# Patient Record
Sex: Male | Born: 1995 | Race: Black or African American | Hispanic: No | Marital: Single | Smoking: Never smoker
Health system: Southern US, Community
[De-identification: ages and names within clinical notes are randomized; demographics above are authoritative.]

---
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
## 2015-06-21 ENCOUNTER — Emergency Department (HOSPITAL_COMMUNITY)
Admission: EM | Admit: 2015-06-21 | Discharge: 2015-06-21 | Disposition: A | Discharge status: Home / Self Care | Payer: No Typology Code available for payment source | Attending: Emergency Medicine | Admitting: Emergency Medicine

## 2015-06-21 ENCOUNTER — Encounter (HOSPITAL_COMMUNITY): Payer: Self-pay | Admitting: Emergency Medicine

## 2015-06-21 DIAGNOSIS — Y9389 Activity, other specified: Secondary | ICD-10-CM | POA: Diagnosis not present

## 2015-06-21 DIAGNOSIS — S3992XA Unspecified injury of lower back, initial encounter: Secondary | ICD-10-CM | POA: Diagnosis not present

## 2015-06-21 DIAGNOSIS — Y998 Other external cause status: Secondary | ICD-10-CM | POA: Insufficient documentation

## 2015-06-21 DIAGNOSIS — S8992XA Unspecified injury of left lower leg, initial encounter: Secondary | ICD-10-CM | POA: Insufficient documentation

## 2015-06-21 DIAGNOSIS — S199XXA Unspecified injury of neck, initial encounter: Secondary | ICD-10-CM | POA: Diagnosis not present

## 2015-06-21 DIAGNOSIS — Y92410 Unspecified street and highway as the place of occurrence of the external cause: Secondary | ICD-10-CM | POA: Diagnosis not present

## 2015-06-21 DIAGNOSIS — S8991XA Unspecified injury of right lower leg, initial encounter: Secondary | ICD-10-CM | POA: Insufficient documentation

## 2015-06-21 MED ORDER — IBUPROFEN 600 MG PO TABS: 600.0000 mg | ORAL_TABLET | Freq: Four times a day (QID) | ORAL | Status: DC | PRN
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
## 2015-06-21 NOTE — ED Notes (Signed)
Declined W/C at D/C and was escorted to lobby by RN. 

## 2015-06-23 ENCOUNTER — Emergency Department (HOSPITAL_COMMUNITY): Payer: No Typology Code available for payment source

## 2015-06-23 ENCOUNTER — Encounter (HOSPITAL_COMMUNITY): Payer: Self-pay | Admitting: Emergency Medicine

## 2015-06-23 ENCOUNTER — Emergency Department (HOSPITAL_COMMUNITY)
Admission: EM | Admit: 2015-06-23 | Discharge: 2015-06-23 | Disposition: A | Discharge status: Home / Self Care | Payer: No Typology Code available for payment source | Attending: Emergency Medicine | Admitting: Emergency Medicine

## 2015-06-23 DIAGNOSIS — S3992XD Unspecified injury of lower back, subsequent encounter: Secondary | ICD-10-CM | POA: Insufficient documentation

## 2015-06-23 MED ORDER — CYCLOBENZAPRINE HCL 10 MG PO TABS
5.0000 mg | ORAL_TABLET | Freq: Once | ORAL | Status: AC
  Administered 2015-06-23: 5 mg via ORAL
  Filled 2015-06-23: qty 1

## 2015-06-23 MED ORDER — CYCLOBENZAPRINE HCL 5 MG PO TABS: 5.0000 mg | ORAL_TABLET | Freq: Three times a day (TID) | ORAL | Status: DC | PRN

## 2015-06-23 NOTE — Discharge Instructions (Signed)
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
## 2015-06-23 NOTE — ED Notes (Signed)
Pt verbalizes understanding of discharge instructions. NAD. VSS. A/O x4. Ambulatory with steady gait.

## 2015-06-23 NOTE — ED Notes (Signed)
Patient transported to X-ray 
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
## 2021-07-03 ENCOUNTER — Encounter: Payer: Self-pay | Admitting: Emergency Medicine

## 2021-07-03 ENCOUNTER — Ambulatory Visit
Admission: EM | Admit: 2021-07-03 | Discharge: 2021-07-03 | Disposition: A | Discharge status: Home / Self Care | Payer: Self-pay | Attending: Urgent Care | Admitting: Urgent Care

## 2021-07-03 ENCOUNTER — Other Ambulatory Visit: Payer: Self-pay

## 2021-07-03 DIAGNOSIS — L739 Follicular disorder, unspecified: Secondary | ICD-10-CM

## 2021-07-03 MED ORDER — CEPHALEXIN 500 MG PO CAPS: 500.0000 mg | ORAL_CAPSULE | Freq: Three times a day (TID) | ORAL | 0 refills | Status: DC

## 2021-07-03 NOTE — ED Provider Notes (Signed)
[facility]   MRN: [id number] DOB: [birth year]  Subjective:   Kurt Austin is a 25 y.o. male presenting for 3 day history of acute onset mildly painful bumps over the pubic area.  Patient states that he recently shaved the area using clippers and did not use a car.  Denies fever, nausea, vomiting, abdominal or pelvic pain, dysuria.  No blisterlike rashes.  No current facility-administered medications for this encounter.  Current Outpatient Medications:    acetaminophen-codeine (TYLENOL #3) 300-30 MG per tablet, Take 1-2 tablets by mouth every 6 (six) hours as needed for pain., Disp: 15 tablet, Rfl: 0   cyclobenzaprine (FLEXERIL) 5 MG tablet, Take 1 tablet (5 mg total) by mouth 3 (three) times daily as needed for muscle spasms. (Patient not taking: Reported on 07/03/2021), Disp: 30 tablet, Rfl: 0   ibuprofen (ADVIL,MOTRIN) 600 MG tablet, Take 1 tablet (600 mg total) by mouth every 6 (six) hours as needed for mild pain or moderate pain., Disp: 15 tablet, Rfl: 0  Facility-Administered Medications Ordered in Other Encounters:    cefTRIAXone (ROCEPHIN) injection 250 mg, 250 mg, Intramuscular, Once, Jimmie Molly, MD   No Known Allergies  History reviewed. No pertinent past medical history.   History reviewed. No pertinent surgical history.  History reviewed. No pertinent family history.  Social History   Tobacco Use   Smoking status: Never  Substance Use Topics   Alcohol use: No   Drug use: No    ROS   Objective:   Vitals: BP (!) 147/72 (BP Location: Left Arm)   Pulse 83   Temp 98.2 F (36.8 C) (Oral)   Resp 18   SpO2 97%   Physical Exam Constitutional:      General: He is not in acute distress.    Appearance: Normal appearance. He is well-developed and normal weight. He is not ill-appearing, toxic-appearing or diaphoretic.  HENT:     Head: Normocephalic and atraumatic.     Right Ear: External ear normal.     Left Ear: External ear normal.     Nose:  Nose normal.     Mouth/Throat:     Pharynx: Oropharynx is clear.  Eyes:     General: No scleral icterus.       Right eye: No discharge.        Left eye: No discharge.     Extraocular Movements: Extraocular movements intact.     Pupils: Pupils are equal, round, and reactive to light.  Cardiovascular:     Rate and Rhythm: Normal rate.  Pulmonary:     Effort: Pulmonary effort is normal.  Musculoskeletal:     Cervical back: Normal range of motion.  Skin:    Findings: Rash (3 distinct solitary nodular lesions associated with hair follicles) present.  Neurological:     Mental Status: He is alert and oriented to person, place, and time.  Psychiatric:        Mood and Affect: Mood normal.        Behavior: Behavior normal.        Thought Content: Thought content normal.        Judgment: Judgment normal.    Assessment and Plan :   PDMP not reviewed this encounter.  1. Folliculitis     Start Keflex to address folliculitis.  Counseled patient on wound care and recommended using guards for his clippers going forward.  Low suspicion for monkey box, genital herpes given physical exam findings.  Counseled patient on potential for adverse  effects with medications prescribed/recommended today, ER and return-to-clinic precautions discussed, patient verbalized understanding.    Wallis Bamberg, PA-C 07/03/21 1511

## 2021-07-03 NOTE — ED Triage Notes (Signed)
Pt sts rash in genital area after shaving with new products x 3 days

## 2021-07-08 ENCOUNTER — Ambulatory Visit: Payer: Self-pay

## 2021-07-09 ENCOUNTER — Ambulatory Visit: Payer: Self-pay

## 2021-07-09 ENCOUNTER — Encounter: Payer: Self-pay | Admitting: General Practice

## 2021-07-09 ENCOUNTER — Other Ambulatory Visit: Payer: Self-pay

## 2021-07-09 ENCOUNTER — Ambulatory Visit
Admission: EM | Admit: 2021-07-09 | Discharge: 2021-07-09 | Disposition: A | Discharge status: Home / Self Care | Payer: Self-pay | Attending: Internal Medicine | Admitting: Internal Medicine

## 2021-07-09 DIAGNOSIS — T7840XA Allergy, unspecified, initial encounter: Secondary | ICD-10-CM

## 2021-07-09 DIAGNOSIS — L739 Follicular disorder, unspecified: Secondary | ICD-10-CM

## 2021-07-09 MED ORDER — DOXYCYCLINE HYCLATE 100 MG PO CAPS: 100.0000 mg | ORAL_CAPSULE | Freq: Two times a day (BID) | ORAL | 0 refills | Status: DC

## 2021-07-09 NOTE — Discharge Instructions (Addendum)
You have been prescribed a new antibiotic to take for skin infection. You may take over the counter benadryl to help with allergic reaction rash. Follow up if symptoms do not resolve.

## 2021-07-09 NOTE — ED Triage Notes (Signed)
Shaved the groin area, started itching, follicles bump developed , provider precibed antibiotic, took x1 week, developed body rashes, then stopped taking the medication

## 2021-07-09 NOTE — ED Provider Notes (Signed)
[facility]    CSN: [id number] Arrival date & time: 07/09/21  1660      History   Chief Complaint Chief Complaint  Patient presents with   Rash    HPI Kurt Austin is a 25 y.o. male.   Patient presents for reevaluation of infected hair follicles. Patient was seen at urgent care on 8/21 and was prescribed cephalexin for folliculitis in the groin. Patient states that he took the medication for 4 days, and then broke out in an itchy rash. He stopped taking the medication when this happened. Folliculitis has improved in appearance per patient. Denies any fever or drainage from lesions. Patient has not yet taken any OTC medications to help alleviate rash. Denies any difficulty breathing.      Rash  History reviewed. No pertinent past medical history.  There are no problems to display for this patient.   History reviewed. No pertinent surgical history.     Home Medications    Prior to Admission medications   Medication Sig Start Date End Date Taking? Authorizing Provider  doxycycline (VIBRAMYCIN) 100 MG capsule Take 1 capsule (100 mg total) by mouth 2 (two) times daily. 07/09/21  Yes Lance Muss, FNP  acetaminophen-codeine (TYLENOL #3) 300-30 MG per tablet Take 1-2 tablets by mouth every 6 (six) hours as needed for pain. 08/08/12   Jimmie Molly, MD  cyclobenzaprine (FLEXERIL) 5 MG tablet Take 1 tablet (5 mg total) by mouth 3 (three) times daily as needed for muscle spasms. Patient not taking: Reported on 07/03/2021 06/23/15   Araceli Bouche, DO  ibuprofen (ADVIL,MOTRIN) 600 MG tablet Take 1 tablet (600 mg total) by mouth every 6 (six) hours as needed for mild pain or moderate pain. 06/21/15   Trixie Dredge, PA-C    Family History History reviewed. No pertinent family history.  Social History Social History   Tobacco Use   Smoking status: Never  Substance Use Topics   Alcohol use: No   Drug use: No     Allergies   Cephalexin   Review of  Systems Review of Systems  Skin:  Positive for rash.  Per HPI  Physical Exam Triage Vital Signs ED Triage Vitals  Enc Vitals Group     BP 07/09/21 0932 (!) 144/82     Pulse Rate 07/09/21 0932 97     Resp 07/09/21 0932 18     Temp 07/09/21 0932 98.1 F (36.7 C)     Temp Source 07/09/21 0932 Oral     SpO2 07/09/21 0932 95 %     Weight --      Height --      Head Circumference --      Peak Flow --      Pain Score 07/09/21 0938 2     Pain Loc --      Pain Edu? --      Excl. in GC? --    No data found.  Updated Vital Signs BP (!) 144/82 (BP Location: Left Arm)   Pulse 97   Temp 98.1 F (36.7 C) (Oral)   Resp 18   SpO2 95%   Visual Acuity Right Eye Distance:   Left Eye Distance:   Bilateral Distance:    Right Eye Near:   Left Eye Near:    Bilateral Near:     Physical Exam Exam conducted with a chaperone present.  Constitutional:      Appearance: Normal appearance.  HENT:     Head: Normocephalic  and atraumatic.  Eyes:     Extraocular Movements: Extraocular movements intact.     Conjunctiva/sclera: Conjunctivae normal.  Pulmonary:     Effort: Pulmonary effort is normal.  Skin:    General: Skin is warm and dry.     Findings: Lesion and rash present. Rash is urticarial.     Comments: Three raised lesions that are consistent with hair follicles are present to suprapubic area directly above penis. Lesions do not have any drainage and appear to be healing well.   Patient also have erythematous diffuse rash present to bilateral arms.   Neurological:     General: No focal deficit present.     Mental Status: He is alert and oriented to person, place, and time. Mental status is at baseline.  Psychiatric:        Mood and Affect: Mood normal.        Behavior: Behavior normal.        Thought Content: Thought content normal.        Judgment: Judgment normal.     UC Treatments / Results  Labs (all labs ordered are listed, but only abnormal results are  displayed) Labs Reviewed - No data to display  EKG   Radiology No results found.  Procedures Procedures (including critical care time)  Medications Ordered in UC Medications - No data to display  Initial Impression / Assessment and Plan / UC Course  I have reviewed the triage vital signs and the nursing notes.  Pertinent labs & imaging results that were available during my care of the patient were reviewed by me and considered in my medical decision making (see chart for details).     Will start doxycycline for treatment of folliculitis. Advised patient to also use warm compresses. Patient to uses OTC benadryl to help treat allergic reaction. Cephalexin added to allergy list. Advised patient to go to the hospital if difficulty breathing starts. Discussed strict return precautions. Patient verbalized understanding and is agreeable with plan.  Final Clinical Impressions(s) / UC Diagnoses   Final diagnoses:  Folliculitis  Allergic reaction to drug, initial encounter     Discharge Instructions      You have been prescribed a new antibiotic to take for skin infection. You may take over the counter benadryl to help with allergic reaction rash. Follow up if symptoms do not resolve.      ED Prescriptions     Medication Sig Dispense Auth. Provider   doxycycline (VIBRAMYCIN) 100 MG capsule Take 1 capsule (100 mg total) by mouth 2 (two) times daily. 20 capsule Lance Muss, FNP      PDMP not reviewed this encounter.   Lance Muss, FNP 07/09/21 1009

## 2021-07-10 ENCOUNTER — Encounter: Payer: Self-pay | Admitting: Emergency Medicine

## 2021-07-10 ENCOUNTER — Ambulatory Visit
Admission: EM | Admit: 2021-07-10 | Discharge: 2021-07-10 | Disposition: A | Discharge status: Home / Self Care | Payer: Self-pay | Attending: Internal Medicine | Admitting: Internal Medicine

## 2021-07-10 DIAGNOSIS — R21 Rash and other nonspecific skin eruption: Secondary | ICD-10-CM | POA: Insufficient documentation

## 2021-07-10 DIAGNOSIS — Z113 Encounter for screening for infections with a predominantly sexual mode of transmission: Secondary | ICD-10-CM | POA: Insufficient documentation

## 2021-07-10 NOTE — ED Provider Notes (Signed)
[facility]    CSN: [id number] Arrival date & time: 07/10/21  3557      History   Chief Complaint Chief Complaint  Patient presents with   Rash   SEXUALLY TRANSMITTED DISEASE    HPI Kurt Austin is a 24 y.o. male.   Patient presents for reevaluation of the rash to arms, face, back that has worsened since visit yesterday.  Patient thought he was having allergic reaction to antibiotic at previous visit, but rash has changed and worsened since yesterday.  Rash is slightly itchy but is not painful.  Denies any known fevers or sick contacts.  Denies any difficulty breathing.  Denies any changes to lotions, soaps, detergents, foods, etc.  Patient also requesting STD testing.  Denies any known exposure or symptoms.   Rash  History reviewed. No pertinent past medical history.  There are no problems to display for this patient.   History reviewed. No pertinent surgical history.     Home Medications    Prior to Admission medications   Medication Sig Start Date End Date Taking? Authorizing Provider  acetaminophen-codeine (TYLENOL #3) 300-30 MG per tablet Take 1-2 tablets by mouth every 6 (six) hours as needed for pain. 08/08/12   Jimmie Molly, MD  cyclobenzaprine (FLEXERIL) 5 MG tablet Take 1 tablet (5 mg total) by mouth 3 (three) times daily as needed for muscle spasms. Patient not taking: Reported on 07/03/2021 06/23/15   Araceli Bouche, DO  doxycycline (VIBRAMYCIN) 100 MG capsule Take 1 capsule (100 mg total) by mouth 2 (two) times daily. 07/09/21   Lance Muss, FNP  ibuprofen (ADVIL,MOTRIN) 600 MG tablet Take 1 tablet (600 mg total) by mouth every 6 (six) hours as needed for mild pain or moderate pain. 06/21/15   Trixie Dredge, PA-C    Family History History reviewed. No pertinent family history.  Social History Social History   Tobacco Use   Smoking status: Never   Smokeless tobacco: Never  Substance Use Topics   Alcohol use: No   Drug use: No      Allergies   Cephalexin   Review of Systems Review of Systems Per HPI  Physical Exam Triage Vital Signs ED Triage Vitals [07/10/21 0838]  Enc Vitals Group     BP (!) 148/92     Pulse Rate (!) 109     Resp 16     Temp 98.1 F (36.7 C)     Temp Source Oral     SpO2 95 %     Weight      Height      Head Circumference      Peak Flow      Pain Score 0     Pain Loc      Pain Edu?      Excl. in GC?    No data found.  Updated Vital Signs BP (!) 148/92 (BP Location: Left Arm)   Pulse (!) 109   Temp 98.1 F (36.7 C) (Oral)   Resp 16   SpO2 95%   Visual Acuity Right Eye Distance:   Left Eye Distance:   Bilateral Distance:    Right Eye Near:   Left Eye Near:    Bilateral Near:     Physical Exam Constitutional:      Appearance: Normal appearance.  HENT:     Head: Normocephalic and atraumatic.  Eyes:     Extraocular Movements: Extraocular movements intact.     Conjunctiva/sclera: Conjunctivae normal.  Pulmonary:  Effort: Pulmonary effort is normal.  Skin:    General: Skin is warm and dry.     Findings: Rash present. Rash is urticarial and vesicular.     Comments: Diffuse, raised, vesicular lesions present throughout bilateral arms, chest, face, back.  Neurological:     General: No focal deficit present.     Mental Status: He is alert and oriented to person, place, and time. Mental status is at baseline.  Psychiatric:        Mood and Affect: Mood normal.        Behavior: Behavior normal.        Thought Content: Thought content normal.        Judgment: Judgment normal.     UC Treatments / Results  Labs (all labs ordered are listed, but only abnormal results are displayed) Labs Reviewed  MONKEYPOX VIRUS DNA, QUALITATIVE REAL-TIME PCR  MONKEYPOX VIRUS DNA, QUALITATIVE REAL-TIME PCR  HIV ANTIBODY (ROUTINE TESTING W REFLEX)  RPR  CYTOLOGY, (ORAL, ANAL, URETHRAL) ANCILLARY ONLY    EKG   Radiology No results found.  Procedures Procedures  (including critical care time)  Medications Ordered in UC Medications - No data to display  Initial Impression / Assessment and Plan / UC Course  I have reviewed the triage vital signs and the nursing notes.  Pertinent labs & imaging results that were available during my care of the patient were reviewed by me and considered in my medical decision making (see chart for details).     Rash is highly suspicious of Monkey Pox. Two viral swabs obtained from left arm and lower back.  Advised patient to quarantine until test results are complete.  Communicable disease branch, infection prevention, and infectious disease doctor on call Staci Righter) notified per protocol.  Supervising physician also notified.  HIV and RPR test pending as recommended by infectious disease doctor previously.  Patient also requesting urethral swab to be performed.  Per protocol, patient was asked sexual preference, and patient stated they usually have sexual intercourse with males. Discussed strict return precautions. Patient verbalized understanding and is agreeable with plan.  Final Clinical Impressions(s) / UC Diagnoses   Final diagnoses:  Screening examination for venereal disease  Rash and nonspecific skin eruption     Discharge Instructions      Your test for monkey pox is pending. We will call if this is positive. You will need to quarantine until test results are complete.      ED Prescriptions   None    PDMP not reviewed this encounter.   Lance Muss, FNP 07/10/21 [phone]

## 2021-07-10 NOTE — Discharge Instructions (Addendum)
Your test for monkey pox is pending. We will call if this is positive. You will need to quarantine until test results are complete.

## 2021-07-10 NOTE — ED Triage Notes (Signed)
Seen yesterday for a rash, states the rash is spreading to face. Wants to be tested for STDs.

## 2021-07-12 LAB — MONKEYPOX VIRUS DNA, QUALITATIVE REAL-TIME PCR
Orthopoxvirus DNA, QL PCR: DETECTED — AB
Orthopoxvirus DNA, QL PCR: DETECTED — AB

## 2021-07-12 LAB — HIV ANTIBODY (ROUTINE TESTING W REFLEX): HIV Screen 4th Generation wRfx: NONREACTIVE

## 2021-07-12 LAB — CYTOLOGY, (ORAL, ANAL, URETHRAL) ANCILLARY ONLY
Chlamydia: NEGATIVE
Comment: NEGATIVE
Comment: NEGATIVE
Comment: NORMAL
Neisseria Gonorrhea: NEGATIVE
Trichomonas: NEGATIVE

## 2021-07-12 LAB — RPR: RPR Ser Ql: NONREACTIVE

## 2021-07-13 ENCOUNTER — Ambulatory Visit (INDEPENDENT_AMBULATORY_CARE_PROVIDER_SITE_OTHER): Payer: Self-pay | Admitting: Physician Assistant

## 2021-07-13 ENCOUNTER — Other Ambulatory Visit: Payer: Self-pay

## 2021-07-13 ENCOUNTER — Encounter: Payer: Self-pay | Admitting: Physician Assistant

## 2021-07-13 VITALS — BP 147/85 | HR 100 | Temp 98.6°F | Wt 190.0 lb

## 2021-07-13 DIAGNOSIS — B04 Monkeypox: Secondary | ICD-10-CM

## 2021-07-13 MED ORDER — TPOXX 200 MG PO CAPS: 600.0000 mg | ORAL_CAPSULE | Freq: Two times a day (BID) | ORAL | 0 refills | Status: AC

## 2021-07-13 NOTE — Patient Instructions (Signed)
Codee nice meeting you today.  With monkeypox you were provided treatment with TPOXX after we discussed and reviewed Informed consent.  Copy is provided for your reference.  Please take as directed with the fatty meal and water 30 minutes before each dose.  Remain isolated home and from pet. Disinfect surfaces and linens as we discussed once lesions resolved. We will follow up next weds.  Maintain diary throughout treatment. Please contact us if you develop any side effects to drug or you continue to develop lesions 2-3 days after initiating treatment.

## 2021-07-13 NOTE — Progress Notes (Signed)
Dr. Odette Fraction with Infectious Disease was contacted in regards to positive results for MonkeyPox. Per ID, patient will need to follow up at infectious disease outpatient office [facility]) for further evaluation and treatment. Dr. Zigmund Daniel stated that they would contact staff at office to call patient to set up an appointment. Patient was notified and provided with contact information.

## 2021-07-13 NOTE — Progress Notes (Signed)
Counseled patient on tecovirimat administration. Patient will take 3 capsules (600 mg) by mouth twice daily for 14 days. Gave 84 tablets to complete treatment. Counseled to take with a full, fatty meal of at least 600 calories and 25 gm of fat. Gave him a chart to monitor his lesions and document his doses. Counseled that the most common side effect is headaches. He will reach out if he has any questions or concerns.   

## 2021-07-13 NOTE — Progress Notes (Signed)
Subjective:    Patient ID: Kurt Austin, male    DOB: [birth year], 25 y.o.   MRN: [id number]  Chief Complaint  Patient presents with   confirmed MPX by ED 07/10/21 seek TPOXX treatment     HPI:  Kurt Austin is a 26 y.o. male who presents today for HMPX treatment. He was confirmed by ED 07/10/21. Onset of lesions were 07/07/2021 located on arm appeared like "pimples"  they continued to spread all over body. Location shaft of penis, back, torso, soles of feet, face, upper and lower extremities. Lesions described as initially painful,red, raised and fluid filled.  No recent travel. No known contacts dxd with MPX or similar rash. No roommates, 1 pet dog who is isolating.  Sexual hx: MSM and denies any sexual partners in over 1 year. He has been "scared" and would like to discuss PrEP. He has had minimal contact with others-currently not working and has no car-will go to [employer] only.   He denies oral lesions, rectal pain or pain with Bms.   Allergies  Allergen Reactions   Cephalexin Rash      Outpatient Medications Prior to Visit  Medication Sig Dispense Refill   acetaminophen-codeine (TYLENOL #3) 300-30 MG per tablet Take 1-2 tablets by mouth every 6 (six) hours as needed for pain. 15 tablet 0   cyclobenzaprine (FLEXERIL) 5 MG tablet Take 1 tablet (5 mg total) by mouth 3 (three) times daily as needed for muscle spasms. (Patient not taking: Reported on 07/03/2021) 30 tablet 0   doxycycline (VIBRAMYCIN) 100 MG capsule Take 1 capsule (100 mg total) by mouth 2 (two) times daily. 20 capsule 0   ibuprofen (ADVIL,MOTRIN) 600 MG tablet Take 1 tablet (600 mg total) by mouth every 6 (six) hours as needed for mild pain or moderate pain. 15 tablet 0   Facility-Administered Medications Prior to Visit  Medication Dose Route Frequency Provider Last Rate Last Admin   cefTRIAXone (ROCEPHIN) injection 250 mg  250 mg Intramuscular Once Jimmie Molly, MD         History reviewed. No  pertinent past medical history.   History reviewed. No pertinent surgical history.     Review of Systems  Constitutional: Negative.   HENT: Negative.  Negative for nosebleeds.   Respiratory: Negative.    Cardiovascular: Negative.   Genitourinary:  Positive for genital sores.  Skin:  Positive for rash.  Hematological:  Negative for adenopathy.  Psychiatric/Behavioral: Negative.       Objective:    BP (!) 147/85   Pulse 100   Temp 98.6 F (37 C) (Oral)   Wt 190 lb (86.2 kg)  Nursing note and vital signs reviewed.  Physical Exam Vitals reviewed.  Constitutional:      Appearance: Normal appearance.  HENT:     Head: Normocephalic and atraumatic.     Mouth/Throat:     Mouth: Mucous membranes are moist.     Pharynx: Oropharynx is clear.  Eyes:     Extraocular Movements: Extraocular movements intact.     Conjunctiva/sclera: Conjunctivae normal.     Pupils: Pupils are equal, round, and reactive to light.  Cardiovascular:     Rate and Rhythm: Normal rate and regular rhythm.     Heart sounds: Normal heart sounds. No murmur heard.   No gallop.  Pulmonary:     Effort: Pulmonary effort is normal.     Breath sounds: Normal breath sounds.  Musculoskeletal:     Cervical back: Normal  range of motion and neck supple.  Lymphadenopathy:     Cervical: Cervical adenopathy present.  Skin:    Comments: Lesions scattered over all surfaces of body several maculopapular lesions 5 mm in diameter, with erythema at margins, Several are vesicular measuring 1 cm in diameter along extremities.  See photos. No exudate or d/c  Neurological:     General: No focal deficit present.     Mental Status: He is alert and oriented to person, place, and time.  Psychiatric:        Mood and Affect: Mood normal.        Behavior: Behavior normal.        Thought Content: Thought content normal.        Judgment: Judgment normal.              No flowsheet data found.     Assessment & Plan:     Patient Active Problem List   Diagnosis Date Noted   Human monkeypox 07/13/2021   Obtained IC, reviewed personally with patient for 20 minutes all questions answered.  Discussed isolation and typical evolution of lesions. Diary provided ot maintain at home Interested in PrEP and scheduled with Marchelle Folks to discuss following treatment on 07/28/21.  TPOXX 600 mg twice daily x 14 days Reviewed labs and notes from ED 07/10/21 and 07/09/21    Problem List Items Addressed This Visit       Other   Human monkeypox - Primary   Relevant Medications   tecovirimat (TPOXX) capsule     I am having Reichen L. Mcmasters start on Tpoxx. I am also having him maintain his acetaminophen-codeine, ibuprofen, cyclobenzaprine, and doxycycline.   Meds ordered this encounter  Medications   tecovirimat (TPOXX) capsule    Sig: Take 3 capsules (600 mg total) by mouth 2 (two) times daily with a meal for 14 days.    Dispense:  84 capsule    Refill:  0    Order Specific Question:   Supervising Provider    Answer:   VAN DAM, CORNELIUS N [[id number]]     Follow-up: Return in about 1 week (around 07/20/2021) for interim treatment remote visit.Marland Kitchen

## 2021-07-20 ENCOUNTER — Other Ambulatory Visit: Payer: Self-pay

## 2021-07-20 ENCOUNTER — Telehealth (INDEPENDENT_AMBULATORY_CARE_PROVIDER_SITE_OTHER): Payer: Self-pay | Admitting: Physician Assistant

## 2021-07-20 ENCOUNTER — Encounter: Payer: Self-pay | Admitting: Physician Assistant

## 2021-07-20 DIAGNOSIS — B04 Monkeypox: Secondary | ICD-10-CM

## 2021-07-20 NOTE — Patient Instructions (Addendum)
Continue tpoxx as instructed Follow up in 1 week Remain isolated at home Disinfect and sterilize linens

## 2021-07-20 NOTE — Progress Notes (Addendum)
Subjective:    Patient ID: Kurt Austin, male    DOB: 06-28-1996, 25 y.o.   MRN: [id number]  Chief Complaint  Patient presents with   Follow-up    Interim tpoxx treatment for hmpx visit     Virtual Visit via Telephone/Video Note   I connected with on 07/20/2021 at by telephone and verified that I am speaking with the correct person using two identifiers.   I discussed the limitations, risks, security and privacy concerns of performing an evaluation and management service by telephone and the availability of in person appointments. I also discussed with the patient that there may be a patient responsible charge related to this service. The patient expressed understanding and agreed to proceed.  Location:  Patient: Home Provider: Clinic   HPI:  Kurt Austin is a 25 y.o. male   Kurt Austin is a 25 y.o. male reports today for follow up with TPOXX treatment day 8 for HMPX.  He has been taking medication as directed and tolerating it well.  He has had no new lesions since initiating treatment on 07/13/2021.  Lesions began 07/07/2021,  today  is day 14 of illness.  He reports all lesions scabbed over and fell off, new skin growth has not occurred yet.  He has been isolating at home.  He will return 07/27/21 for final visit and start PrEP.   Allergies  Allergen Reactions   Cephalexin Rash      Outpatient Medications Prior to Visit  Medication Sig Dispense Refill   acetaminophen-codeine (TYLENOL #3) 300-30 MG per tablet Take 1-2 tablets by mouth every 6 (six) hours as needed for pain. 15 tablet 0   cyclobenzaprine (FLEXERIL) 5 MG tablet Take 1 tablet (5 mg total) by mouth 3 (three) times daily as needed for muscle spasms. (Patient not taking: Reported on 07/03/2021) 30 tablet 0   doxycycline (VIBRAMYCIN) 100 MG capsule Take 1 capsule (100 mg total) by mouth 2 (two) times daily. 20 capsule 0   ibuprofen (ADVIL,MOTRIN) 600 MG tablet Take 1 tablet (600 mg total) by mouth every  6 (six) hours as needed for mild pain or moderate pain. 15 tablet 0   tecovirimat (TPOXX) capsule Take 3 capsules (600 mg total) by mouth 2 (two) times daily with a meal for 14 days. 84 capsule 0   Facility-Administered Medications Prior to Visit  Medication Dose Route Frequency Provider Last Rate Last Admin   cefTRIAXone (ROCEPHIN) injection 250 mg  250 mg Intramuscular Once Jimmie Molly, MD         History reviewed. No pertinent past medical history.   History reviewed. No pertinent surgical history.     Review of Systems  Constitutional:  Negative for activity change, appetite change, chills, diaphoresis, fatigue, fever and unexpected weight change.  HENT:  Negative for mouth sores and sore throat.   Eyes: Negative.   Respiratory:  Negative for cough and wheezing.   Cardiovascular:  Negative for chest pain and leg swelling.  Gastrointestinal:  Negative for anal bleeding, blood in stool, constipation, rectal pain and vomiting.  Genitourinary: Negative.   Musculoskeletal:  Negative for arthralgias and myalgias.  Skin:  Positive for rash.  Neurological:  Negative for dizziness, weakness, numbness and headaches.  Hematological:  Negative for adenopathy.     Objective:        Assessment & Plan:  I spent a total of 10 minutes on the telephone with patient Continue maintaining diary at home and bring to next visit  Continue taking TPOXX 3 caps bid until completed as directed 30 minutes after high fat, caloric diet and glass of water Follow up 07/27/21 and we will initiate prep and have final visit for TPOXX  Remain isolated at home Disinfect surfaces and sterilize linens  Problem List Items Addressed This Visit       Other   Human monkeypox - Primary     I am having Natale L. Cassin maintain his acetaminophen-codeine, ibuprofen, cyclobenzaprine, doxycycline, and Tpoxx.   No orders of the defined types were placed in this encounter.    I discussed the assessment and  treatment plan with the patient. The patient was provided an opportunity to ask questions and all were answered. The patient agreed with the plan and demonstrated an understanding of the instructions.   The patient was advised to call back or seek an in-person evaluation if the symptoms worsen or if the condition fails to improve as anticipated.   I provided   minutes of non-face-to-face time during this encounter.  Follow-up: Return in about 1 week (around 07/27/2021) for MPX.

## 2021-07-27 ENCOUNTER — Other Ambulatory Visit: Payer: Self-pay

## 2021-07-27 ENCOUNTER — Ambulatory Visit: Payer: Self-pay | Admitting: Pharmacist

## 2021-07-27 ENCOUNTER — Ambulatory Visit (INDEPENDENT_AMBULATORY_CARE_PROVIDER_SITE_OTHER): Payer: Self-pay | Admitting: Physician Assistant

## 2021-07-27 ENCOUNTER — Encounter: Payer: Self-pay | Admitting: Physician Assistant

## 2021-07-27 ENCOUNTER — Telehealth: Payer: Self-pay

## 2021-07-27 ENCOUNTER — Other Ambulatory Visit (HOSPITAL_COMMUNITY): Payer: Self-pay

## 2021-07-27 VITALS — BP 143/86 | HR 85 | Temp 98.6°F | Wt 190.0 lb

## 2021-07-27 DIAGNOSIS — B04 Monkeypox: Secondary | ICD-10-CM

## 2021-07-27 NOTE — Telephone Encounter (Signed)
[facility] Patient Advocate Encounter ? ?Insurance verification completed.   ? ?The patient is uninsured and will need patient assistance for medication. ? ?We can complete the application and will need to meet with the patient for signatures and income documentation. ? ?Tyjai Charbonnet, CPhT ?Specialty Pharmacy Patient Advocate ?[facility] ?Phone: [phone] ?Fax:  [phone]  ?

## 2021-07-27 NOTE — Patient Instructions (Signed)
Isolate until final scabbing and new skin growth in left groin PrEP scheduled for 2 week at 9:15 am

## 2021-07-27 NOTE — Progress Notes (Signed)
Subjective:    Patient ID: Kurt Austin, male    DOB: 1996/11/12, 25 y.o.   MRN: [id number]  Chief Complaint  Patient presents with   Follow-up    Final visit TPOXX treatment for MPX     HPI:  Kurt Austin is a 25 y.o. male reports today for follow up regarding HMPX.  He reports majoirty of scabs have fallen off and new skin growth has occurred, scabbing remains in left inguinal region.  He has 3 capsules remaining of TPOXX, which he will complete today.  He tolerated medication without any Aes or SAEs.  He is currently not working and has remained isolated throughout.  Onset of symptoms 07/07/21, treatment began 07/13/21.  He is currently asymptomatic.     Allergies  Allergen Reactions   Cephalexin Rash      Outpatient Medications Prior to Visit  Medication Sig Dispense Refill   acetaminophen-codeine (TYLENOL #3) 300-30 MG per tablet Take 1-2 tablets by mouth every 6 (six) hours as needed for pain. 15 tablet 0   cyclobenzaprine (FLEXERIL) 5 MG tablet Take 1 tablet (5 mg total) by mouth 3 (three) times daily as needed for muscle spasms. (Patient not taking: Reported on 07/03/2021) 30 tablet 0   doxycycline (VIBRAMYCIN) 100 MG capsule Take 1 capsule (100 mg total) by mouth 2 (two) times daily. 20 capsule 0   ibuprofen (ADVIL,MOTRIN) 600 MG tablet Take 1 tablet (600 mg total) by mouth every 6 (six) hours as needed for mild pain or moderate pain. 15 tablet 0   tecovirimat (TPOXX) capsule Take 3 capsules (600 mg total) by mouth 2 (two) times daily with a meal for 14 days. 84 capsule 0   Facility-Administered Medications Prior to Visit  Medication Dose Route Frequency Provider Last Rate Last Admin   cefTRIAXone (ROCEPHIN) injection 250 mg  250 mg Intramuscular Once Jimmie Molly, MD         History reviewed. No pertinent past medical history.   History reviewed. No pertinent surgical history.     Review of Systems  Constitutional: Negative.   HENT: Negative.    Eyes:  Negative.   Respiratory: Negative.    Cardiovascular: Negative.   Gastrointestinal: Negative.   Genitourinary: Negative.   Musculoskeletal: Negative.   Skin:  Positive for rash.  Neurological: Negative.   Hematological:  Negative for adenopathy.  Psychiatric/Behavioral: Negative.       Objective:    BP (!) 143/86   Pulse 85   Temp 98.6 F (37 C) (Oral)   Wt 190 lb (86.2 kg)  Nursing note and vital signs reviewed.  Physical Exam Constitutional:      Appearance: Normal appearance.  HENT:     Head: Normocephalic and atraumatic.  Eyes:     Extraocular Movements: Extraocular movements intact.     Pupils: Pupils are equal, round, and reactive to light.  Cardiovascular:     Rate and Rhythm: Normal rate and regular rhythm.  Pulmonary:     Effort: Pulmonary effort is normal.     Breath sounds: Normal breath sounds.  Skin:    General: Skin is warm and dry.     Findings: Lesion (all MPX lesions resolved with new skin growth apart from 3 lesions with scabbing present along right inguinal region, no signs of secondary infection) present.  Neurological:     General: No focal deficit present.     Mental Status: He is alert and oriented to person, place, and time.  Psychiatric:  Mood and Affect: Mood normal.        Thought Content: Thought content normal.     No flowsheet data found.     Assessment & Plan:   Remain isolated until scabs of remaining 3 lesions in inguinal region fall off and new skin growth is present. He would like to re schedule PrEP visit in 2 weeks once MPX has resolved entirely.   Patient Active Problem List   Diagnosis Date Noted   Human monkeypox 07/13/2021     Problem List Items Addressed This Visit       Other   Human monkeypox - Primary     I am having Kurt Austin maintain his acetaminophen-codeine, ibuprofen, cyclobenzaprine, doxycycline, and Tpoxx.   No orders of the defined types were placed in this  encounter.    Follow-up: Return in about 2 weeks (around 08/10/2021) for PrEP.

## 2021-08-09 ENCOUNTER — Telehealth: Payer: Self-pay

## 2021-08-09 ENCOUNTER — Other Ambulatory Visit (HOSPITAL_COMMUNITY): Payer: Self-pay

## 2021-08-09 NOTE — Telephone Encounter (Signed)
[facility] Patient Advocate Encounter ? ?Insurance verification completed.   ? ?The patient is uninsured and will need patient assistance for medication. ? ?We can complete the application and will need to meet with the patient for signatures and income documentation. ? ?Shriley Joffe, CPhT ?Specialty Pharmacy Patient Advocate ?[facility] ?Phone: [phone] ?Fax:  [phone]  ?

## 2021-08-10 ENCOUNTER — Encounter: Payer: Self-pay | Admitting: Pharmacist

## 2021-08-22 NOTE — Progress Notes (Deleted)
Patient left.

## 2022-04-01 ENCOUNTER — Ambulatory Visit
Admission: RE | Admit: 2022-04-01 | Discharge: 2022-04-01 | Disposition: A | Discharge status: Home / Self Care | Payer: BC Managed Care – PPO | Source: Ambulatory Visit | Attending: Internal Medicine | Admitting: Internal Medicine

## 2022-04-01 VITALS — BP 132/88 | HR 70 | Temp 98.2°F | Resp 18 | Ht 70.0 in | Wt 180.0 lb

## 2022-04-01 DIAGNOSIS — R1031 Right lower quadrant pain: Secondary | ICD-10-CM | POA: Diagnosis present

## 2022-04-01 DIAGNOSIS — R3915 Urgency of urination: Secondary | ICD-10-CM | POA: Diagnosis present

## 2022-04-01 LAB — POCT URINALYSIS DIP (MANUAL ENTRY)
Bilirubin, UA: NEGATIVE
Blood, UA: NEGATIVE
Glucose, UA: NEGATIVE mg/dL
Ketones, POC UA: NEGATIVE mg/dL
Leukocytes, UA: NEGATIVE
Nitrite, UA: NEGATIVE
Protein Ur, POC: NEGATIVE mg/dL
Spec Grav, UA: 1.02 (ref 1.010–1.025)
Urobilinogen, UA: 0.2 E.U./dL
pH, UA: 6.5 (ref 5.0–8.0)

## 2022-04-01 MED ORDER — TAMSULOSIN HCL 0.4 MG PO CAPS: 0.4000 mg | ORAL_CAPSULE | Freq: Every day | ORAL | 0 refills | Status: DC

## 2022-04-01 NOTE — ED Triage Notes (Signed)
Patient c/o possible UTI from holding his urine at work.  Patient is having some lower abdomen discomfort.  No dysuria, urgency, frequency or discharge.  No concern for STI.

## 2022-04-01 NOTE — Discharge Instructions (Signed)
Your urine did not show signs of urinary tract infection.  We will send urine culture and penile swab off for further testing.  It is highly likely that you may have a kidney stone given your current symptoms and history of kidney stones.  You are being treated with Flomax which will help with the expulsion of this kidney stone.  Please go the hospital if abdominal pain worsens or you develop nausea, vomiting, fever, body aches, chills.  Please refrain from sexual activity until test results and treatment are complete.

## 2022-04-01 NOTE — ED Provider Notes (Signed)
[facility]    CSN: [id number] Arrival date & time: 04/01/22  0853      History   Chief Complaint Chief Complaint  Patient presents with   Urinary Frequency    think i have a uti. no discharge , no aches , no rashes , no pain when i pee etc - Entered by patient   Appointment    HPI Kurt Austin is a 26 y.o. male.   Patient presents due to concerns of urinary tract infection given that he has had right lower quadrant abdominal pain as well as some intermittent urinary frequency.  Denies urinary burning, penile discharge, testicular pain, fever.  He does endorse some right lower back pain as well.  Patient attributes symptoms to having to hold his urine while at work.  Denies any known exposure to STD.  Patient does report that he has a history of kidney stones and has had similar abdominal pain in the past associated with a kidney stone.  Denies any hematuria.  Denies nausea, vomiting, diarrhea, constipation.  Patient having normal bowel movements and denies blood in stool.  Denies any fevers.   Urinary Frequency   History reviewed. No pertinent past medical history.  Patient Active Problem List   Diagnosis Date Noted   Human monkeypox 07/13/2021    History reviewed. No pertinent surgical history.     Home Medications    Prior to Admission medications   Medication Sig Start Date End Date Taking? Authorizing Provider  tamsulosin (FLOMAX) 0.4 MG CAPS capsule Take 1 capsule (0.4 mg total) by mouth daily. 04/01/22  Yes Vernida Mcnicholas, Rolly SalterHaley E, FNP  acetaminophen-codeine (TYLENOL #3) 300-30 MG per tablet Take 1-2 tablets by mouth every 6 (six) hours as needed for pain. 08/08/12   Jimmie Mollyoll, Paolo, MD  cyclobenzaprine (FLEXERIL) 5 MG tablet Take 1 tablet (5 mg total) by mouth 3 (three) times daily as needed for muscle spasms. Patient not taking: Reported on 07/03/2021 06/23/15   Araceli Boucheumley, [place] N, DO  doxycycline (VIBRAMYCIN) 100 MG capsule Take 1 capsule (100 mg total) by  mouth 2 (two) times daily. 07/09/21   Gustavus BryantMound, Daevon Holdren E, FNP  ibuprofen (ADVIL,MOTRIN) 600 MG tablet Take 1 tablet (600 mg total) by mouth every 6 (six) hours as needed for mild pain or moderate pain. 06/21/15   Trixie DredgeWest, Emily, PA-C    Family History History reviewed. No pertinent family history.  Social History Social History   Tobacco Use   Smoking status: Never   Smokeless tobacco: Never  Substance Use Topics   Alcohol use: No   Drug use: No     Allergies   Cephalexin   Review of Systems Review of Systems Per HPI  Physical Exam Triage Vital Signs ED Triage Vitals  Enc Vitals Group     BP 04/01/22 0910 132/88     Pulse Rate 04/01/22 0910 70     Resp 04/01/22 0910 18     Temp 04/01/22 0910 98.2 F (36.8 C)     Temp Source 04/01/22 0910 Oral     SpO2 04/01/22 0910 97 %     Weight 04/01/22 0912 180 lb (81.6 kg)     Height 04/01/22 0912 5\' 10"  (1.778 m)     Head Circumference --      Peak Flow --      Pain Score 04/01/22 0911 0     Pain Loc --      Pain Edu? --      Excl. in GC? --  No data found.  Updated Vital Signs BP 132/88 (BP Location: Left Arm)   Pulse 70   Temp 98.2 F (36.8 C) (Oral)   Resp 18   Ht 5\' 10"  (1.778 m)   Wt 180 lb (81.6 kg)   SpO2 97%   BMI 25.83 kg/m   Visual Acuity Right Eye Distance:   Left Eye Distance:   Bilateral Distance:    Right Eye Near:   Left Eye Near:    Bilateral Near:     Physical Exam Constitutional:      General: He is not in acute distress.    Appearance: Normal appearance. He is not toxic-appearing or diaphoretic.  HENT:     Head: Normocephalic and atraumatic.  Eyes:     Extraocular Movements: Extraocular movements intact.     Conjunctiva/sclera: Conjunctivae normal.  Cardiovascular:     Rate and Rhythm: Normal rate and regular rhythm.     Pulses: Normal pulses.     Heart sounds: Normal heart sounds.  Pulmonary:     Effort: Pulmonary effort is normal. No respiratory distress.     Breath sounds: Normal  breath sounds.  Abdominal:     General: Abdomen is flat. Bowel sounds are normal. There is no distension.     Palpations: Abdomen is soft.     Tenderness: There is no abdominal tenderness.  Musculoskeletal:     Cervical back: Normal.     Thoracic back: Normal.     Lumbar back: No tenderness.  Skin:    General: Skin is warm and dry.  Neurological:     General: No focal deficit present.     Mental Status: He is alert and oriented to person, place, and time. Mental status is at baseline.  Psychiatric:        Mood and Affect: Mood normal.        Behavior: Behavior normal.        Thought Content: Thought content normal.        Judgment: Judgment normal.     UC Treatments / Results  Labs (all labs ordered are listed, but only abnormal results are displayed) Labs Reviewed  URINE CULTURE  POCT URINALYSIS DIP (MANUAL ENTRY)  CYTOLOGY, (ORAL, ANAL, URETHRAL) ANCILLARY ONLY    EKG   Radiology No results found.  Procedures Procedures (including critical care time)  Medications Ordered in UC Medications - No data to display  Initial Impression / Assessment and Plan / UC Course  I have reviewed the triage vital signs and the nursing notes.  Pertinent labs & imaging results that were available during my care of the patient were reviewed by me and considered in my medical decision making (see chart for details).     Urinalysis was completely normal and not indicating urinary tract infection.  Will send urine culture given patient's associated symptoms.  Cytology swab pending to rule out STD as patient's cause of symptoms.  Highly suspicious of kidney stone given patient's history and current symptoms.  Will treat with Flomax to help with stone expulsion.  Also discussed the possibility of appendicitis with right lower quadrant abdominal pain.  Although, there is low concern for this given no rebound tenderness on exam and no other associated fever or symptoms.  Patient was advised  that if abdominal pain worsens or if he develops any new symptoms that he would need to go to the hospital for CT imaging of the abdomen.  Discussed strict return and ER precautions.  Patient verbalized understanding  and was agreeable with plan. Final Clinical Impressions(s) / UC Diagnoses   Final diagnoses:  Urinary urgency  Right lower quadrant abdominal pain     Discharge Instructions      Your urine did not show signs of urinary tract infection.  We will send urine culture and penile swab off for further testing.  It is highly likely that you may have a kidney stone given your current symptoms and history of kidney stones.  You are being treated with Flomax which will help with the expulsion of this kidney stone.  Please go the hospital if abdominal pain worsens or you develop nausea, vomiting, fever, body aches, chills.  Please refrain from sexual activity until test results and treatment are complete.    ED Prescriptions     Medication Sig Dispense Auth. Provider   tamsulosin (FLOMAX) 0.4 MG CAPS capsule Take 1 capsule (0.4 mg total) by mouth daily. 30 capsule [place], Acie Fredrickson, [place]      PDMP not reviewed this encounter.   Gustavus Bryant, [place] 04/01/22 1007

## 2022-04-02 LAB — URINE CULTURE: Culture: <10000 — AB

## 2022-04-03 ENCOUNTER — Telehealth: Payer: Self-pay

## 2022-04-03 LAB — CYTOLOGY, (ORAL, ANAL, URETHRAL) ANCILLARY ONLY
Chlamydia: NEGATIVE
Comment: NEGATIVE
Comment: NEGATIVE
Comment: NORMAL
Neisseria Gonorrhea: NEGATIVE
Trichomonas: NEGATIVE

## 2022-04-07 ENCOUNTER — Encounter (HOSPITAL_BASED_OUTPATIENT_CLINIC_OR_DEPARTMENT_OTHER): Payer: Self-pay

## 2022-04-07 ENCOUNTER — Emergency Department (HOSPITAL_BASED_OUTPATIENT_CLINIC_OR_DEPARTMENT_OTHER): Payer: BC Managed Care – PPO

## 2022-04-07 ENCOUNTER — Other Ambulatory Visit: Payer: Self-pay

## 2022-04-07 ENCOUNTER — Emergency Department (HOSPITAL_BASED_OUTPATIENT_CLINIC_OR_DEPARTMENT_OTHER)
Admission: EM | Admit: 2022-04-07 | Discharge: 2022-04-07 | Disposition: A | Discharge status: Home / Self Care | Payer: BC Managed Care – PPO | Attending: Emergency Medicine | Admitting: Emergency Medicine

## 2022-04-07 DIAGNOSIS — R739 Hyperglycemia, unspecified: Secondary | ICD-10-CM | POA: Diagnosis not present

## 2022-04-07 DIAGNOSIS — R1030 Lower abdominal pain, unspecified: Secondary | ICD-10-CM

## 2022-04-07 DIAGNOSIS — R1032 Left lower quadrant pain: Secondary | ICD-10-CM | POA: Insufficient documentation

## 2022-04-07 DIAGNOSIS — R109 Unspecified abdominal pain: Secondary | ICD-10-CM | POA: Diagnosis present

## 2022-04-07 LAB — URINALYSIS, ROUTINE W REFLEX MICROSCOPIC
Bilirubin Urine: NEGATIVE
Glucose, UA: NEGATIVE mg/dL
Hgb urine dipstick: NEGATIVE
Ketones, ur: NEGATIVE mg/dL
Leukocytes,Ua: NEGATIVE
Nitrite: NEGATIVE
Protein, ur: 30 mg/dL — AB
Specific Gravity, Urine: 1.037 — ABNORMAL HIGH (ref 1.005–1.030)
pH: 6 (ref 5.0–8.0)

## 2022-04-07 LAB — CBC
HCT: 42.2 % (ref 39.0–52.0)
Hemoglobin: 14.2 g/dL (ref 13.0–17.0)
MCH: 27.5 pg (ref 26.0–34.0)
MCHC: 33.6 g/dL (ref 30.0–36.0)
MCV: 81.6 fL (ref 80.0–100.0)
Platelets: 238 10*3/uL (ref 150–400)
RBC: 5.17 MIL/uL (ref 4.22–5.81)
RDW: 13.4 % (ref 11.5–15.5)
WBC: 7.1 10*3/uL (ref 4.0–10.5)
nRBC: 0 % (ref 0.0–0.2)

## 2022-04-07 LAB — COMPREHENSIVE METABOLIC PANEL
ALT: 31 U/L (ref 0–44)
AST: 21 U/L (ref 15–41)
Albumin: 4.7 g/dL (ref 3.5–5.0)
Alkaline Phosphatase: 107 U/L (ref 38–126)
Anion gap: 8 (ref 5–15)
BUN: 18 mg/dL (ref 6–20)
CO2: 26 mmol/L (ref 22–32)
Calcium: 9.5 mg/dL (ref 8.9–10.3)
Chloride: 103 mmol/L (ref 98–111)
Creatinine, Ser: 1.07 mg/dL (ref 0.61–1.24)
GFR, Estimated: >60 mL/min (ref >60)
Glucose, Bld: 146 mg/dL — ABNORMAL HIGH (ref 70–99)
Potassium: 3.6 mmol/L (ref 3.5–5.1)
Sodium: 137 mmol/L (ref 135–145)
Total Bilirubin: 0.6 mg/dL (ref 0.3–1.2)
Total Protein: 7.8 g/dL (ref 6.5–8.1)

## 2022-04-07 LAB — LIPASE, BLOOD: Lipase: 12 U/L (ref 11–51)

## 2022-04-07 NOTE — ED Provider Notes (Signed)
[facility] EMERGENCY DEPT Provider Note   CSN: [id number] Arrival date & time: 04/07/22  1354     History  Chief Complaint  Patient presents with   Abdominal Pain    Kurt Austin is a 26 y.o. male.  HPI Patient was seen at urgent care [phone].  He reports he has been having abdominal pain.  Oftentimes it is on the left.  He is also experiencing some flank pain.  It was suspected that he might have a kidney stone.  At the time of the patient's evaluation his urinalysis was negative.  STD studies were done and review of EMR shows gonorrhea chlamydia were negative.  Patient ports he still getting pains in his abdomen.  He reports they are not too severe but have been persistent.  He has been taking Flomax as prescribed.  He denies he is having any penile pain any scrotal swelling or testicular pain.    Home Medications Prior to Admission medications   Medication Sig Start Date End Date Taking? Authorizing Provider  acetaminophen-codeine (TYLENOL #3) 300-30 MG per tablet Take 1-2 tablets by mouth every 6 (six) hours as needed for pain. 08/08/12   Jimmie Molly, MD  cyclobenzaprine (FLEXERIL) 5 MG tablet Take 1 tablet (5 mg total) by mouth 3 (three) times daily as needed for muscle spasms. Patient not taking: Reported on 07/03/2021 06/23/15   Araceli Bouche, DO  doxycycline (VIBRAMYCIN) 100 MG capsule Take 1 capsule (100 mg total) by mouth 2 (two) times daily. 07/09/21   Gustavus Bryant, FNP  ibuprofen (ADVIL,MOTRIN) 600 MG tablet Take 1 tablet (600 mg total) by mouth every 6 (six) hours as needed for mild pain or moderate pain. 06/21/15   Trixie Dredge, PA-C  tamsulosin (FLOMAX) 0.4 MG CAPS capsule Take 1 capsule (0.4 mg total) by mouth daily. 04/01/22   Gustavus Bryant, FNP      Allergies    Cephalexin    Review of Systems   Review of Systems 10 systems reviewed negative except as per HPI Physical Exam Updated Vital Signs BP (!) 141/81   Pulse (!) 109   Temp 98.6 F  (37 C)   Resp 17   Ht 5\' 10"  (1.778 m)   Wt 83.9 kg   SpO2 100%   BMI 26.54 kg/m  Physical Exam Constitutional:      Appearance: Normal appearance.  HENT:     Mouth/Throat:     Pharynx: Oropharynx is clear.  Eyes:     Extraocular Movements: Extraocular movements intact.  Cardiovascular:     Rate and Rhythm: Normal rate and regular rhythm.  Pulmonary:     Effort: Pulmonary effort is normal.     Breath sounds: Normal breath sounds.  Abdominal:     General: There is no distension.     Palpations: Abdomen is soft.     Comments: Mild left lower quadrant tenderness.  Mild left CVA tenderness to percussion.  No mass fullness or lymphadenopathy in the groin or suprapubic area.  Musculoskeletal:        General: Normal range of motion.     Right lower leg: No edema.     Left lower leg: No edema.  Skin:    General: Skin is warm and dry.  Neurological:     General: No focal deficit present.     Mental Status: He is alert and oriented to person, place, and time.     Motor: No weakness.     Coordination: Coordination  normal.  Psychiatric:        Mood and Affect: Mood normal.    ED Results / Procedures / Treatments   Labs (all labs ordered are listed, but only abnormal results are displayed) Labs Reviewed  COMPREHENSIVE METABOLIC PANEL - Abnormal; Notable for the following components:      Result Value   Glucose, Bld 146 (*)    All other components within normal limits  URINALYSIS, ROUTINE W REFLEX MICROSCOPIC - Abnormal; Notable for the following components:   Specific Gravity, Urine 1.037 (*)    Protein, ur 30 (*)    Bacteria, UA RARE (*)    All other components within normal limits  LIPASE, BLOOD  CBC    EKG None  Radiology CT Renal Stone Study  Result Date: 04/07/2022 CLINICAL DATA:  Right flank pain, kidney stones EXAM: CT ABDOMEN AND PELVIS WITHOUT CONTRAST TECHNIQUE: Multidetector CT imaging of the abdomen and pelvis was performed following the standard protocol  without IV contrast. RADIATION DOSE REDUCTION: This exam was performed according to the departmental dose-optimization program which includes automated exposure control, adjustment of the mA and/or kV according to patient size and/or use of iterative reconstruction technique. COMPARISON:  None Available. FINDINGS: Lower chest: No acute abnormality. Hepatobiliary: No focal liver abnormality is seen. No gallstones, gallbladder wall thickening, or biliary dilatation. Pancreas: Unremarkable. No pancreatic ductal dilatation or surrounding inflammatory changes. Spleen: Normal in size without focal abnormality. Adrenals/Urinary Tract: Adrenal glands are unremarkable. Kidneys are normal, without focal lesion, or hydronephrosis. Tiny punctate stones in the interpolar collecting system of the right kidney. Bladder is unremarkable. Stomach/Bowel: Stomach is within normal limits. Appendix appears normal. No evidence of bowel wall thickening, distention, or inflammatory changes. Vascular/Lymphatic: No significant vascular findings are present. No enlarged abdominal or pelvic lymph nodes. Reproductive: Prostate is unremarkable. Other: No abdominal wall hernia or abnormality. No abdominopelvic ascites. Musculoskeletal: No acute or significant osseous findings. IMPRESSION: 1. There are 2 tiny punctate stones in the interpolar right renal collecting system without evidence of obstruction. 2. Otherwise, unremarkable CT scan of the abdomen and pelvis. Electronically Signed   By: Malachy Moan M.D.   On: 04/07/2022 15:37    Procedures Procedures    Medications Ordered in ED Medications - No data to display  ED Course/ Medical Decision Making/ A&P                           Medical Decision Making Amount and/or Complexity of Data Reviewed Labs: ordered.   Patient has been seen 6 days ago at urgent care.  At that time he had lower abdominal pain and was counseled to come to the emergency department if he had  persisting or changing pain.  Patient reports that discomfort has persisted at a low level in the lower abdomen.  Repeat labs and CT scan ordered.  Labs normal.  No sign of infection in the urine.  Review of EMR shows that prior urine culture was less than 10,000 CFU.  GC chlamydia were negative from prior swab.  Review of labs today normal with no change.  Kidney function normal.  No leukocytosis.  Patient does have mild hyperglycemia at 146.  Patient is counseled on the necessity of monitoring blood sugars for early diabetes patient voices understanding.  Resource guide for low-cost medical care and referral number in discharge instructions included.  CT shows punctate stones within the right kidney.  Patient is certainly not symptomatic from this.  Patient is counseled on staying hydrated but that no additional specific treatment is indicated at this time.  He is a counseled on follow-up with the PCP for monitoring.  Appendicitis ruled out, retained kidney stone ruled out, no inguinal hernia or lymphadenopathy on exam.  UTI ruled out, GC chlamydia ruled out by swab 5 days ago.  Patient is not symptomatic with dysuria or penile drainage.  Return precautions reviewed.        Final Clinical Impression(s) / ED Diagnoses Final diagnoses:  Lower abdominal pain    Rx / DC Orders ED Discharge Orders     None         Arby BarrettePfeiffer, Adilson Grafton, MD 04/07/22 [phone]

## 2022-04-07 NOTE — Discharge Instructions (Signed)
1.  You have some small stones in your right kidney.  It is not uncommon for people sometimes to have small stones sitting in the kidney.  They do not cause pain when there in the kidney.  They may sit there for very long time without causing any symptoms.  Symptoms of kidney stones typically occur when the stone moved through the ureter (the tube connecting the kidney to the bladder).  At that time people may have severe pain in the lower back or radiating to the lower abdomen or to the genitals.  Stay hydrated but no particular treatment is needed. 2.  You should have a family doctor for recheck.  Your blood sugar was just slightly high at 146.  This should be monitored for early diabetes.  Eat a healthy diet and exercise.  A resource guide has been included in your discharge instructions for low-cost medical care.  You may also use the referral number in your discharge instructions to help find a family physician. 3.  Return to the emergency department if you have new worsening or concerning symptoms.

## 2022-04-07 NOTE — ED Triage Notes (Addendum)
Pt states he thinks he has kidney stones. Pt c/o pain over the kidneys and then points to RUQ and LUQ of abd. States it is cramping. Pt denies urinary symptoms, back pain, or flank pain. Denis N/V/D. Pt states he went to UC this past weekend and "everything was normal."

## 2022-04-11 ENCOUNTER — Encounter: Payer: Self-pay | Admitting: Emergency Medicine

## 2022-04-11 ENCOUNTER — Ambulatory Visit
Admission: EM | Admit: 2022-04-11 | Discharge: 2022-04-11 | Disposition: A | Discharge status: Home / Self Care | Payer: Self-pay | Attending: Internal Medicine | Admitting: Internal Medicine

## 2022-04-11 ENCOUNTER — Other Ambulatory Visit: Payer: Self-pay

## 2022-04-11 DIAGNOSIS — Z113 Encounter for screening for infections with a predominantly sexual mode of transmission: Secondary | ICD-10-CM

## 2022-04-11 NOTE — ED Provider Notes (Signed)
[facility]    CSN: [id number] Arrival date & time: 04/11/22  1043      History   Chief Complaint Chief Complaint  Patient presents with   Exposure to STD    HPI Kurt Austin is a 26 y.o. male.   Patient is here requesting blood work for HIV and syphilis.  Patient denies any current symptoms and simply wants routine STD testing.  He reports that he was seen on 04/01/2022 and had cytology swab completed that was negative but did not get to have blood work.  He is simply requesting just blood work today.  Denies any exposure.   Exposure to STD   History reviewed. No pertinent past medical history.  Patient Active Problem List   Diagnosis Date Noted   Human monkeypox 07/13/2021    History reviewed. No pertinent surgical history.     Home Medications    Prior to Admission medications   Medication Sig Start Date End Date Taking? Authorizing Provider  acetaminophen-codeine (TYLENOL #3) 300-30 MG per tablet Take 1-2 tablets by mouth every 6 (six) hours as needed for pain. 08/08/12   Jimmie Molly, MD  cyclobenzaprine (FLEXERIL) 5 MG tablet Take 1 tablet (5 mg total) by mouth 3 (three) times daily as needed for muscle spasms. Patient not taking: Reported on 07/03/2021 06/23/15   Araceli Bouche, DO  doxycycline (VIBRAMYCIN) 100 MG capsule Take 1 capsule (100 mg total) by mouth 2 (two) times daily. 07/09/21   Gustavus Bryant, FNP  ibuprofen (ADVIL,MOTRIN) 600 MG tablet Take 1 tablet (600 mg total) by mouth every 6 (six) hours as needed for mild pain or moderate pain. 06/21/15   Trixie Dredge, PA-C  tamsulosin (FLOMAX) 0.4 MG CAPS capsule Take 1 capsule (0.4 mg total) by mouth daily. 04/01/22   Gustavus Bryant, FNP    Family History History reviewed. No pertinent family history.  Social History Social History   Tobacco Use   Smoking status: Never   Smokeless tobacco: Never  Vaping Use   Vaping Use: Never used  Substance Use Topics   Alcohol use: Yes   Drug  use: Yes    Types: Marijuana     Allergies   Cephalexin   Review of Systems Review of Systems Per HPI  Physical Exam Triage Vital Signs ED Triage Vitals  Enc Vitals Group     BP 04/11/22 1104 (!) 153/101     Pulse Rate 04/11/22 1104 85     Resp 04/11/22 1104 18     Temp 04/11/22 1104 98.6 F (37 C)     Temp Source 04/11/22 1104 Oral     SpO2 04/11/22 1104 97 %     Weight --      Height --      Head Circumference --      Peak Flow --      Pain Score 04/11/22 1105 0     Pain Loc --      Pain Edu? --      Excl. in GC? --    No data found.  Updated Vital Signs BP (!) 153/101 (BP Location: Left Arm)   Pulse 85   Temp 98.6 F (37 C) (Oral)   Resp 18   SpO2 97%   Visual Acuity Right Eye Distance:   Left Eye Distance:   Bilateral Distance:    Right Eye Near:   Left Eye Near:    Bilateral Near:     Physical Exam Constitutional:  General: He is not in acute distress.    Appearance: Normal appearance. He is not toxic-appearing or diaphoretic.  HENT:     Head: Normocephalic and atraumatic.  Eyes:     Extraocular Movements: Extraocular movements intact.     Conjunctiva/sclera: Conjunctivae normal.  Pulmonary:     Effort: Pulmonary effort is normal.  Neurological:     General: No focal deficit present.     Mental Status: He is alert and oriented to person, place, and time. Mental status is at baseline.  Psychiatric:        Mood and Affect: Mood normal.        Behavior: Behavior normal.        Thought Content: Thought content normal.        Judgment: Judgment normal.     UC Treatments / Results  Labs (all labs ordered are listed, but only abnormal results are displayed) Labs Reviewed  RPR  HIV ANTIBODY (ROUTINE TESTING W REFLEX)    EKG   Radiology No results found.  Procedures Procedures (including critical care time)  Medications Ordered in UC Medications - No data to display  Initial Impression / Assessment and Plan / UC Course  I  have reviewed the triage vital signs and the nursing notes.  Pertinent labs & imaging results that were available during my care of the patient were reviewed by me and considered in my medical decision making (see chart for details).     HIV and RPR pending per patient request.  Patient advised to refrain from sexual activity until test results and treatment are complete.  Discussed return precautions.  Patient verbalized understanding and was agreeable with plan. Final Clinical Impressions(s) / UC Diagnoses   Final diagnoses:  Screening examination for venereal disease     Discharge Instructions      Blood Work is pending.  We will call if it is positive.  Please refrain from sexual activity until test results and treatment are complete.    ED Prescriptions   None    PDMP not reviewed this encounter.   Gustavus Bryant, [place] 04/11/22 1124

## 2022-04-11 NOTE — ED Triage Notes (Signed)
Pt here requesting blood STD testing for HIV and syphilis; pt denies sx

## 2022-04-11 NOTE — Discharge Instructions (Signed)
Blood Work is pending.  We will call if it is positive.  Please refrain from sexual activity until test results and treatment are complete.

## 2022-04-12 LAB — RPR: RPR Ser Ql: NONREACTIVE

## 2022-04-12 LAB — HIV ANTIBODY (ROUTINE TESTING W REFLEX): HIV Screen 4th Generation wRfx: NONREACTIVE

## 2022-07-19 ENCOUNTER — Ambulatory Visit
Admission: EM | Admit: 2022-07-19 | Discharge: 2022-07-19 | Disposition: A | Discharge status: Home / Self Care | Payer: Self-pay | Attending: Urgent Care | Admitting: Urgent Care

## 2022-07-19 DIAGNOSIS — S00512A Abrasion of oral cavity, initial encounter: Secondary | ICD-10-CM | POA: Insufficient documentation

## 2022-07-19 DIAGNOSIS — Z113 Encounter for screening for infections with a predominantly sexual mode of transmission: Secondary | ICD-10-CM | POA: Insufficient documentation

## 2022-07-19 MED ORDER — CHLORHEXIDINE GLUCONATE 0.12 % MT SOLN: OROMUCOSAL | 0 refills | Status: AC

## 2022-07-19 NOTE — ED Provider Notes (Signed)
  [facility]  Note:  This document was prepared using Conservation officer, historic buildings and may include unintentional dictation errors.  MRN: [id number] DOB: February 18, 1996  Subjective:   Kurt Austin is a 26 y.o. male presenting for an evaluation for tongue injury.  Patient accidentally bit himself last week.  Injury occurred on either side of the distal portion of his tongue.  Has been doing salt water gargles and actually used baking soda as well.  No fever, drainage, swelling of the tongue or pain, bleeding.  Patient would also like routine STI check including blood work. Denies dysuria, hematuria, urinary frequency, penile discharge, penile swelling, testicular pain, testicular swelling, anal pain, groin pain.   No chronic medications.    Allergies  Allergen Reactions   Cephalexin Rash    History reviewed. No pertinent past medical history.   History reviewed. No pertinent surgical history.  History reviewed. No pertinent family history.  Social History   Tobacco Use   Smoking status: Never   Smokeless tobacco: Never  Vaping Use   Vaping Use: Never used  Substance Use Topics   Alcohol use: Yes   Drug use: Yes    Types: Marijuana    ROS   Objective:   Vitals: BP (!) 149/85 (BP Location: Right Arm)   Pulse 77   SpO2 95%   Physical Exam Constitutional:      General: He is not in acute distress.    Appearance: Normal appearance. He is well-developed and normal weight. He is not ill-appearing, toxic-appearing or diaphoretic.  HENT:     Head: Normocephalic and atraumatic.     Right Ear: External ear normal.     Left Ear: External ear normal.     Nose: Nose normal.     Mouth/Throat:     Pharynx: Oropharynx is clear.   Eyes:     General: No scleral icterus.       Right eye: No discharge.        Left eye: No discharge.     Extraocular Movements: Extraocular movements intact.  Cardiovascular:     Rate and Rhythm: Normal rate.   Pulmonary:     Effort: Pulmonary effort is normal.  Musculoskeletal:     Cervical back: Normal range of motion.  Neurological:     Mental Status: He is alert and oriented to person, place, and time.  Psychiatric:        Mood and Affect: Mood normal.        Behavior: Behavior normal.        Thought Content: Thought content normal.        Judgment: Judgment normal.     Assessment and Plan :   PDMP not reviewed this encounter.  1. Abrasion of tongue, initial encounter   2. Screen for STD (sexually transmitted disease)    Offered chlorhexidine rinse.  I do not expect this to develop a secondary infection of the tongue.  Reassured the patient.  Routine STI check.  We will treat as appropriate based off of lab results. Counseled patient on potential for adverse effects with medications prescribed/recommended today, ER and return-to-clinic precautions discussed, patient verbalized understanding.    Wallis Bamberg, [place] 07/19/22 1759

## 2022-07-19 NOTE — ED Triage Notes (Signed)
Mouth Sore- Patient states he bite his tongue last week eating and now has sore that he wants to make sure is healing. No pain  Routine STD testing, Swab and Blood work   

## 2022-07-19 NOTE — ED Notes (Signed)
Mouth Sore- Patient states he bite his tongue last week eating and now has sore that he wants to make sure is healing. No pain  Routine STD testing, Swab and Blood work

## 2022-07-20 ENCOUNTER — Ambulatory Visit: Payer: Self-pay

## 2022-07-20 LAB — CYTOLOGY, (ORAL, ANAL, URETHRAL) ANCILLARY ONLY
Chlamydia: NEGATIVE
Comment: NEGATIVE
Comment: NEGATIVE
Comment: NORMAL
Neisseria Gonorrhea: NEGATIVE
Trichomonas: NEGATIVE

## 2022-07-20 LAB — HIV ANTIBODY (ROUTINE TESTING W REFLEX): HIV Screen 4th Generation wRfx: NONREACTIVE

## 2022-07-20 LAB — RPR: RPR Ser Ql: NONREACTIVE

## 2022-10-06 ENCOUNTER — Ambulatory Visit
Admission: EM | Admit: 2022-10-06 | Discharge: 2022-10-06 | Disposition: A | Discharge status: Home / Self Care | Payer: No Typology Code available for payment source | Attending: Urgent Care | Admitting: Urgent Care

## 2022-10-06 DIAGNOSIS — Z113 Encounter for screening for infections with a predominantly sexual mode of transmission: Secondary | ICD-10-CM | POA: Insufficient documentation

## 2022-10-06 NOTE — ED Triage Notes (Addendum)
Pt reports he wants std testing (hiv) and "cancer testing"

## 2022-10-06 NOTE — ED Provider Notes (Signed)
  [facility]  Note:  This document was prepared using Conservation officer, historic buildings and may include unintentional dictation errors.  MRN: [id number] DOB: [birth year]  Subjective:   Kurt Austin is a 26 y.o. male presenting for STI testing.  Patient reports that he would like to get his regular testing for HIV, syphilis and the pain as well.  Has not been sexually active lately but wants to make sure he gets checked routinely. Denies dysuria, hematuria, urinary frequency, penile discharge, penile swelling, testicular pain, testicular swelling, anal pain, groin pain.   No chronic medications.  Allergies  Allergen Reactions   Cephalexin Rash    History reviewed. No pertinent past medical history.   History reviewed. No pertinent surgical history.  History reviewed. No pertinent family history.  Social History   Tobacco Use   Smoking status: Never   Smokeless tobacco: Never  Vaping Use   Vaping Use: Never used  Substance Use Topics   Alcohol use: Yes   Drug use: Yes    Types: Marijuana    ROS   Objective:   Vitals: BP (!) 154/75 (BP Location: Right Arm)   Pulse 77   Temp 98.1 F (36.7 C) (Oral)   Resp 20   SpO2 95%   Physical Exam Constitutional:      General: He is not in acute distress.    Appearance: Normal appearance. He is well-developed and normal weight. He is not ill-appearing, toxic-appearing or diaphoretic.  HENT:     Head: Normocephalic and atraumatic.     Right Ear: External ear normal.     Left Ear: External ear normal.     Nose: Nose normal.     Mouth/Throat:     Pharynx: Oropharynx is clear.  Eyes:     General: No scleral icterus.       Right eye: No discharge.        Left eye: No discharge.     Extraocular Movements: Extraocular movements intact.  Cardiovascular:     Rate and Rhythm: Normal rate.  Pulmonary:     Effort: Pulmonary effort is normal.  Musculoskeletal:     Cervical back: Normal range of  motion.  Neurological:     Mental Status: He is alert and oriented to person, place, and time.  Psychiatric:        Mood and Affect: Mood normal.        Behavior: Behavior normal.        Thought Content: Thought content normal.        Judgment: Judgment normal.     Assessment and Plan :   PDMP not reviewed this encounter.  1. Screen for STD (sexually transmitted disease)     Discussed general screening recommendations including timeline for rate testing with patient.  He would like to pursue all testing involving the blood work and penis swab.  We will treat as appropriate based off of results.   Wallis Bamberg, PA-C 10/06/22 1139

## 2022-10-07 LAB — HIV ANTIBODY (ROUTINE TESTING W REFLEX): HIV Screen 4th Generation wRfx: NONREACTIVE

## 2022-10-07 LAB — RPR: RPR Ser Ql: NONREACTIVE

## 2022-10-09 LAB — CYTOLOGY, (ORAL, ANAL, URETHRAL) ANCILLARY ONLY
Chlamydia: NEGATIVE
Comment: NEGATIVE
Comment: NEGATIVE
Comment: NORMAL
Neisseria Gonorrhea: NEGATIVE
Trichomonas: NEGATIVE

## 2022-10-14 ENCOUNTER — Ambulatory Visit
Admission: EM | Admit: 2022-10-14 | Discharge: 2022-10-14 | Disposition: A | Discharge status: Home / Self Care | Payer: No Typology Code available for payment source | Attending: Internal Medicine | Admitting: Internal Medicine

## 2022-10-14 DIAGNOSIS — H109 Unspecified conjunctivitis: Secondary | ICD-10-CM

## 2022-10-14 MED ORDER — OFLOXACIN 0.3 % OP SOLN: OPHTHALMIC | 0 refills | Status: AC

## 2022-10-14 NOTE — ED Triage Notes (Signed)
Pt c/o left eye conjunctivitis unsure if it is pink eye or if something was blown into eye when walking dog onset ~ 2 days ago

## 2022-10-14 NOTE — Discharge Instructions (Signed)
It appears that you have pinkeye.  I have prescribed an antibiotic drop to help treat this.  Please keep your contac lenses out.  Change pillowcase and linen daily to prevent reinfection or spread of infection.  Follow-up with eye doctor if symptoms persist or worsen.

## 2022-10-14 NOTE — ED Provider Notes (Signed)
[facility]    CSN: [id number] Arrival date & time: 10/14/22  8841      History   Chief Complaint Chief Complaint  Patient presents with   Conjunctivitis    HPI Kurt Austin is a 26 y.o. male.   Patient presents with left eye irritation, pain, drainage that started 2 days ago.  He denies trauma or foreign to the eye.  He does wear contact lenses but reports that he has since taken the contact out of this left eye.  He reports that he had some crustiness this morning upon awakening.  Reports some associated runny nose.  Denies blurry vision.   Conjunctivitis    History reviewed. No pertinent past medical history.  Patient Active Problem List   Diagnosis Date Noted   Human monkeypox 07/13/2021    History reviewed. No pertinent surgical history.     Home Medications    Prior to Admission medications   Medication Sig Start Date End Date Taking? Authorizing Provider  ofloxacin (OCUFLOX) 0.3 % ophthalmic solution Place 1 drop into the left eye every 4 (four) hours for 2 days, THEN 1 drop 4 (four) times daily for 5 days. 10/14/22 10/21/22 Yes Mellissa Conley, Acie Fredrickson, FNP  chlorhexidine (PERIDEX) 0.12 % solution Rinse mouth with 59mL for 30 seconds twice daily. 07/19/22   Wallis Bamberg, PA-C    Family History History reviewed. No pertinent family history.  Social History Social History   Tobacco Use   Smoking status: Never   Smokeless tobacco: Never  Vaping Use   Vaping Use: Never used  Substance Use Topics   Alcohol use: Yes   Drug use: Yes    Types: Marijuana     Allergies   Cephalexin   Review of Systems Review of Systems Per HPI  Physical Exam Triage Vital Signs ED Triage Vitals [10/14/22 1019]  Enc Vitals Group     BP (!) 142/82     Pulse Rate 82     Resp 16     Temp 98.3 F (36.8 C)     Temp Source Oral     SpO2 98 %     Weight      Height      Head Circumference      Peak Flow      Pain Score 3     Pain Loc      Pain Edu?       Excl. in GC?    No data found.  Updated Vital Signs BP (!) 142/82 (BP Location: Left Arm)   Pulse 82   Temp 98.3 F (36.8 C) (Oral)   Resp 16   SpO2 98%   Visual Acuity Right Eye Distance:   Left Eye Distance:   Bilateral Distance:    Right Eye Near:   Left Eye Near:    Bilateral Near:     Physical Exam Constitutional:      General: He is not in acute distress.    Appearance: Normal appearance. He is not toxic-appearing or diaphoretic.  HENT:     Head: Normocephalic and atraumatic.  Eyes:     General: Lids are normal. Lids are everted, no foreign bodies appreciated. Vision grossly intact. Gaze aligned appropriately.     Extraocular Movements: Extraocular movements intact.     Conjunctiva/sclera:     Right eye: Right conjunctiva is not injected. No chemosis, exudate or hemorrhage.    Left eye: Left conjunctiva is injected. Chemosis present. No exudate or hemorrhage.  Pupils: Pupils are equal, round, and reactive to light.     Left eye: No corneal abrasion or fluorescein uptake.     Comments: Scleral redness throughout.  No obvious drainage noted.  No fluorescein reuptake, corneal abrasion, corneal ulcer noted.  Pulmonary:     Effort: Pulmonary effort is normal.  Neurological:     General: No focal deficit present.     Mental Status: He is alert and oriented to person, place, and time. Mental status is at baseline.  Psychiatric:        Mood and Affect: Mood normal.        Behavior: Behavior normal.        Thought Content: Thought content normal.        Judgment: Judgment normal.      UC Treatments / Results  Labs (all labs ordered are listed, but only abnormal results are displayed) Labs Reviewed - No data to display  EKG   Radiology No results found.  Procedures Procedures (including critical care time)  Medications Ordered in UC Medications - No data to display  Initial Impression / Assessment and Plan / UC Course  I have reviewed the triage vital  signs and the nursing notes.  Pertinent labs & imaging results that were available during my care of the patient were reviewed by me and considered in my medical decision making (see chart for details).     Physical exam is consistent with left bacterial conjunctivitis.  Fluorescein stain completed with no obvious foreign body, corneal abrasion, corneal ulcer.  Will treat with ofloxacin given that patient is a contact lens wearer.  Advised patient to keep contact lenses out as well.  Patient to follow-up with established ophthalmologist if symptoms persist or worsen.  Visual acuity appears unchanged.  Discussed return precautions.  Patient verbalized understanding and was agreeable with plan. Final Clinical Impressions(s) / UC Diagnoses   Final diagnoses:  Bacterial conjunctivitis of left eye     Discharge Instructions      It appears that you have pinkeye.  I have prescribed an antibiotic drop to help treat this.  Please keep your contac lenses out.  Change pillowcase and linen daily to prevent reinfection or spread of infection.  Follow-up with eye doctor if symptoms persist or worsen.    ED Prescriptions     Medication Sig Dispense Auth. Provider   ofloxacin (OCUFLOX) 0.3 % ophthalmic solution Place 1 drop into the left eye every 4 (four) hours for 2 days, THEN 1 drop 4 (four) times daily for 5 days. 5 mL Teodora Medici, [place]      PDMP not reviewed this encounter.   Teodora Medici, [place] 10/14/22 1100

## 2023-09-06 IMAGING — CT CT RENAL STONE PROTOCOL
2 of 4 series · 16 of 46 positions shown, 18 images · non-contrast
Comparison: None Available.

CLINICAL DATA: Right flank pain, kidney stones



[Series 2: stone full · axial · 0.70mm/px · z∈[-473,-3]mm · 13 of 102 slices shown, 15 images]
[im 4/102  soft-tissue]
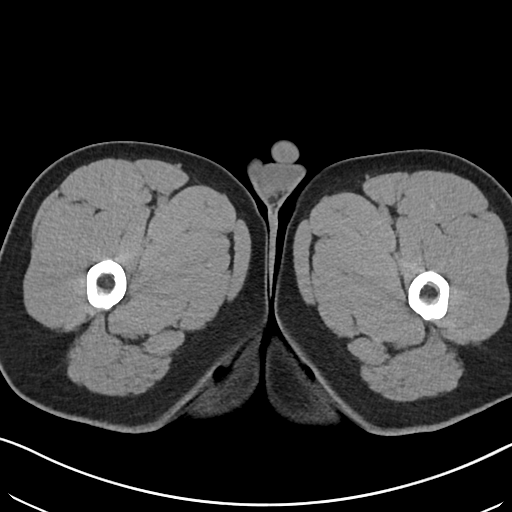
[im 4/102  bone]
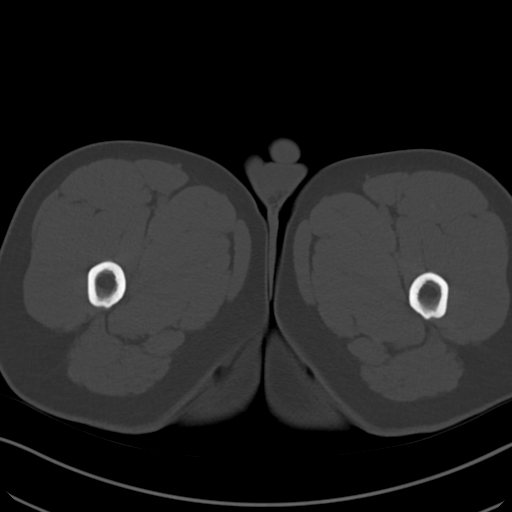
[im 12/102  soft-tissue]
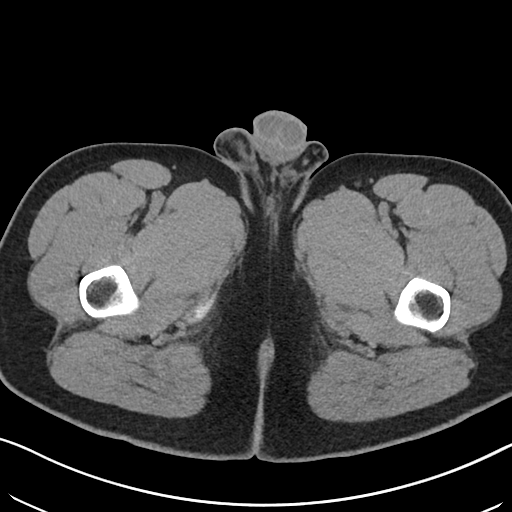
[im 20/102  soft-tissue]
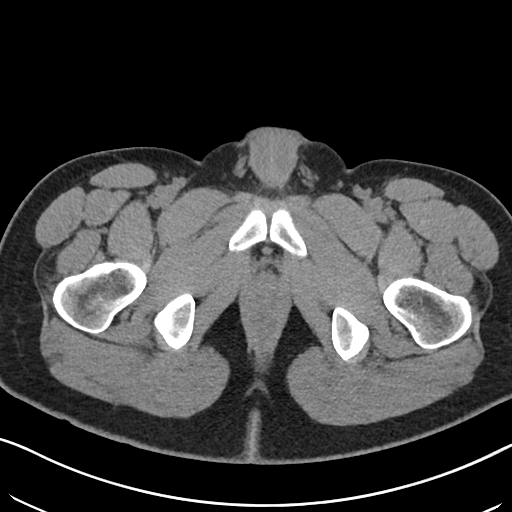
[im 28/102  soft-tissue]
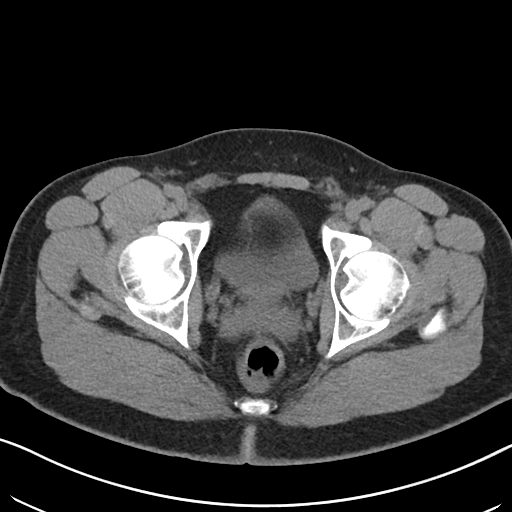
[im 35/102  soft-tissue]
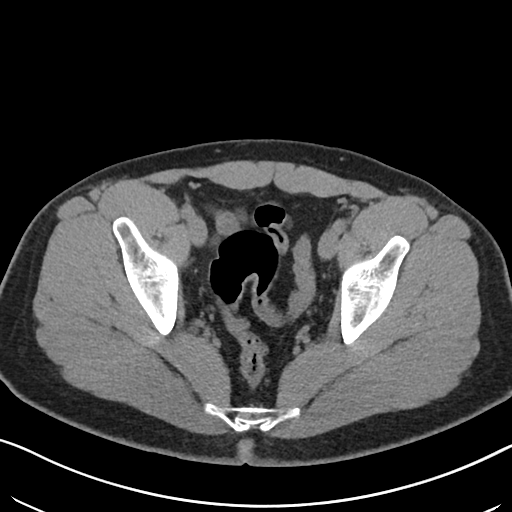
[im 43/102  soft-tissue]
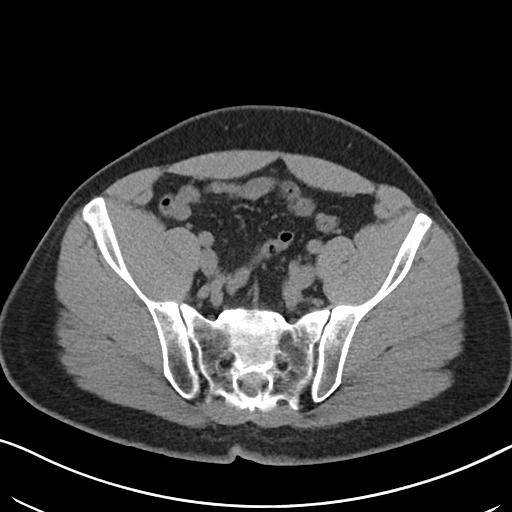
[im 51/102  soft-tissue]
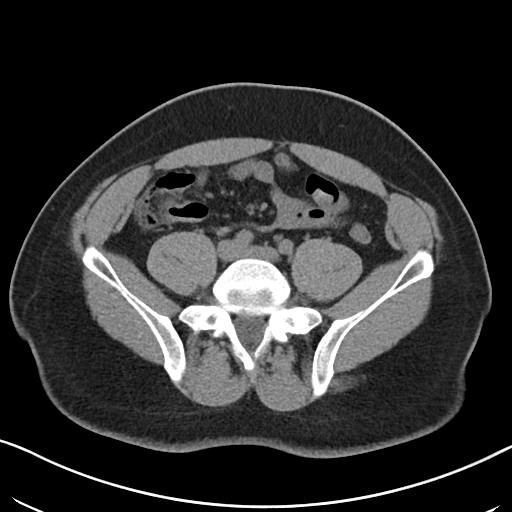
[im 59/102  soft-tissue]
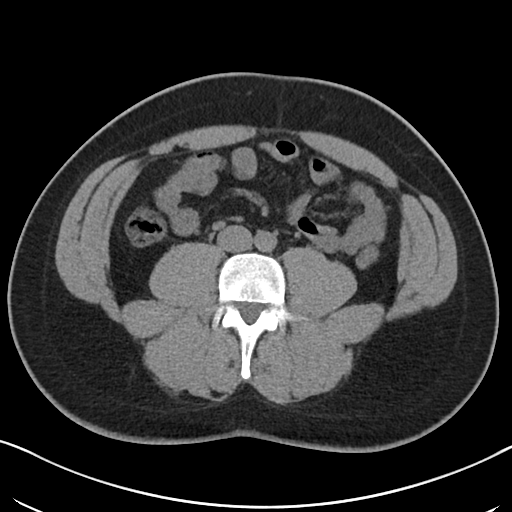
[im 67/102  soft-tissue]
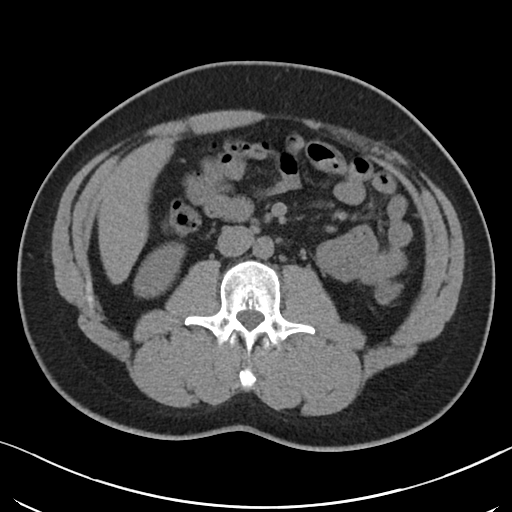
[im 67/102  bone]
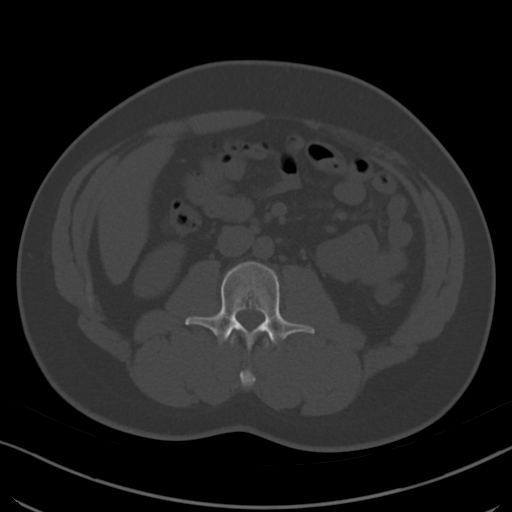
[im 74/102  soft-tissue]
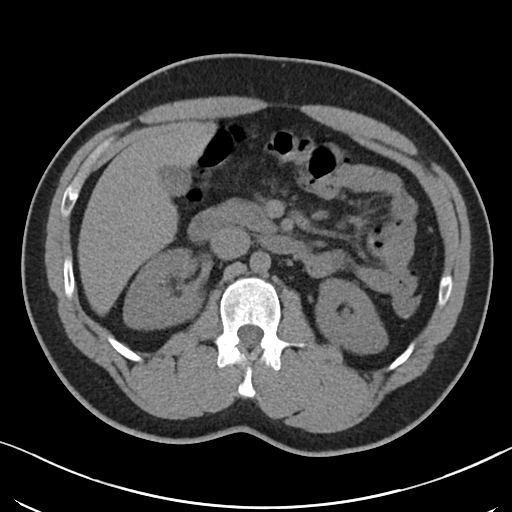
[im 82/102  soft-tissue]
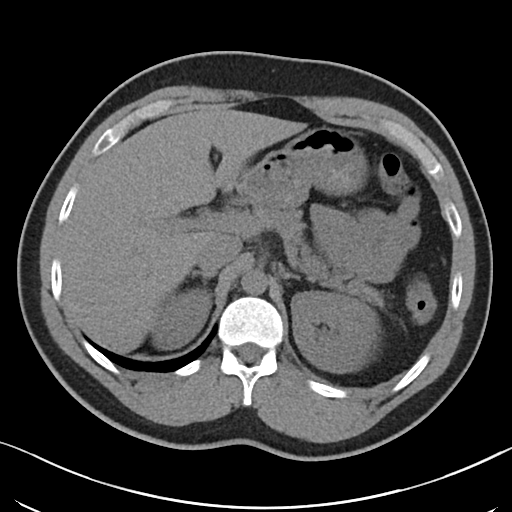
[im 90/102  soft-tissue]
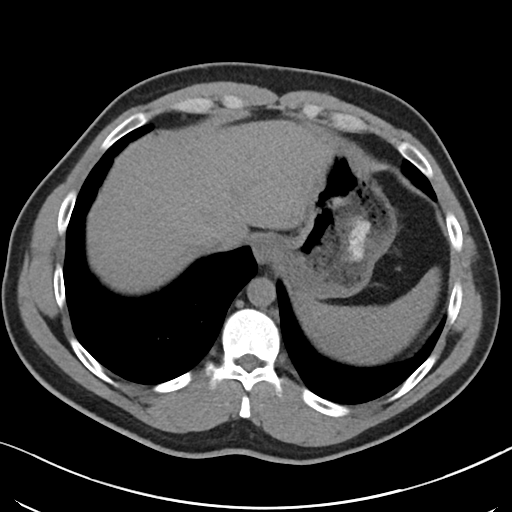
[im 98/102  soft-tissue]
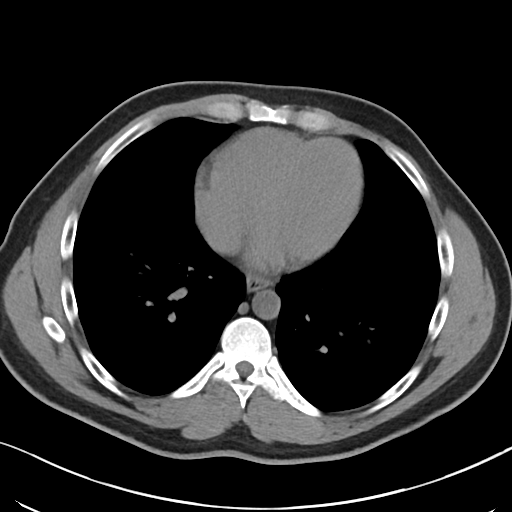

[Series 5: coronal · coronal · 0.77mm/px · 3 of 98 slices shown]
[im 33/98  soft-tissue]
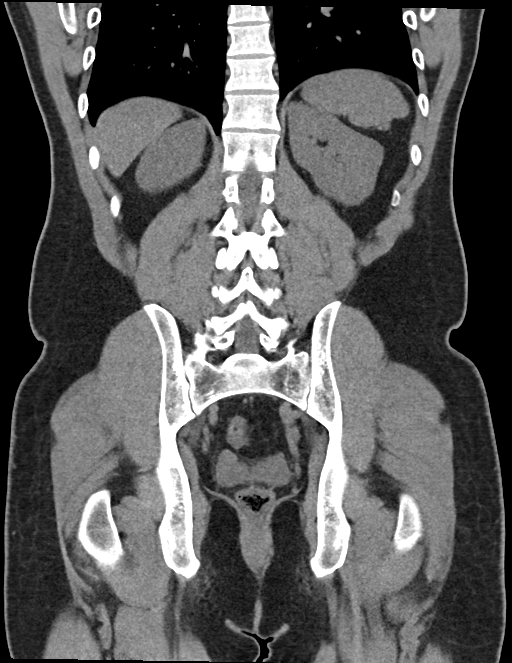
[im 44/98  soft-tissue]
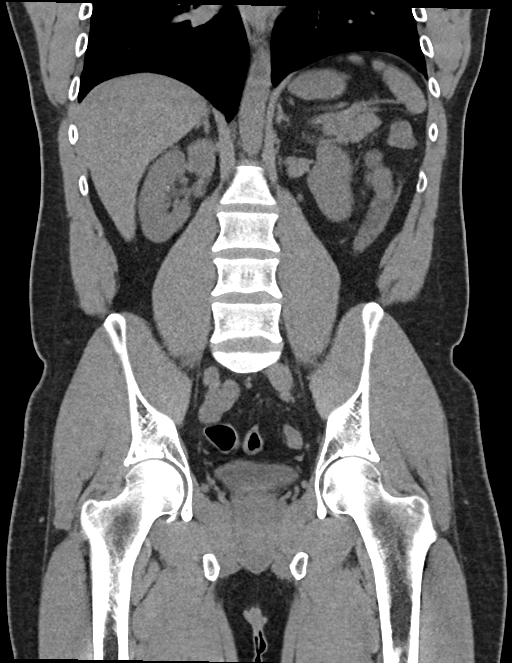
[im 54/98  soft-tissue]
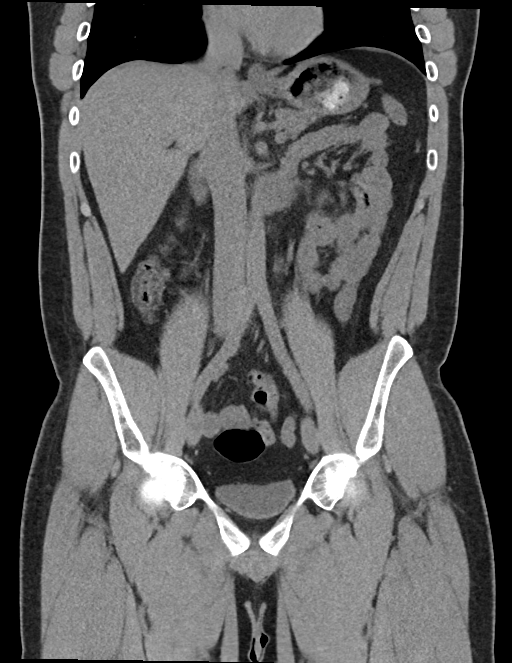

[16 of 46 positions shown; findings below may reference images not displayed]

FINDINGS: Lower chest: No acute abnormality.

Hepatobiliary: No focal liver abnormality is seen. No gallstones,
gallbladder wall thickening, or biliary dilatation.

Pancreas: Unremarkable. No pancreatic ductal dilatation or
surrounding inflammatory changes.

Spleen: Normal in size without focal abnormality.

Adrenals/Urinary Tract: Adrenal glands are unremarkable. Kidneys are
normal, without focal lesion, or hydronephrosis. Tiny punctate
stones in the interpolar collecting system of the right kidney.
Bladder is unremarkable.

Stomach/Bowel: Stomach is within normal limits. Appendix appears
normal. No evidence of bowel wall thickening, distention, or
inflammatory changes.

Vascular/Lymphatic: No significant vascular findings are present. No
enlarged abdominal or pelvic lymph nodes.

Reproductive: Prostate is unremarkable.

Other: No abdominal wall hernia or abnormality. No abdominopelvic
ascites.

Musculoskeletal: No acute or significant osseous findings.
IMPRESSION: 1. There are 2 tiny punctate stones in the interpolar right renal
collecting system without evidence of obstruction.
2. Otherwise, unremarkable CT scan of the abdomen and pelvis.
